# Patient Record
Sex: Male | Born: 1995 | Race: Black or African American | Hispanic: No | Marital: Single | Smoking: Never smoker
Health system: Southern US, Community
[De-identification: ages and names within clinical notes are randomized; demographics above are authoritative.]

---

## 2015-04-23 ENCOUNTER — Encounter (HOSPITAL_COMMUNITY): Payer: Self-pay | Admitting: Emergency Medicine

## 2015-04-23 ENCOUNTER — Emergency Department (HOSPITAL_COMMUNITY)
Admission: EM | Admit: 2015-04-23 | Discharge: 2015-04-23 | Disposition: A | Discharge status: Home / Self Care | Payer: BLUE CROSS/BLUE SHIELD | Attending: Emergency Medicine | Admitting: Emergency Medicine

## 2015-04-23 ENCOUNTER — Emergency Department (HOSPITAL_COMMUNITY): Payer: BLUE CROSS/BLUE SHIELD

## 2015-04-23 DIAGNOSIS — S3991XA Unspecified injury of abdomen, initial encounter: Secondary | ICD-10-CM | POA: Diagnosis not present

## 2015-04-23 DIAGNOSIS — Y998 Other external cause status: Secondary | ICD-10-CM | POA: Diagnosis not present

## 2015-04-23 DIAGNOSIS — S40012A Contusion of left shoulder, initial encounter: Secondary | ICD-10-CM | POA: Insufficient documentation

## 2015-04-23 DIAGNOSIS — S0001XA Abrasion of scalp, initial encounter: Secondary | ICD-10-CM | POA: Diagnosis not present

## 2015-04-23 DIAGNOSIS — Y9389 Activity, other specified: Secondary | ICD-10-CM | POA: Insufficient documentation

## 2015-04-23 DIAGNOSIS — Z23 Encounter for immunization: Secondary | ICD-10-CM | POA: Diagnosis not present

## 2015-04-23 DIAGNOSIS — S0993XA Unspecified injury of face, initial encounter: Secondary | ICD-10-CM | POA: Diagnosis not present

## 2015-04-23 DIAGNOSIS — S71152A Open bite, left thigh, initial encounter: Secondary | ICD-10-CM | POA: Diagnosis not present

## 2015-04-23 DIAGNOSIS — S71112A Laceration without foreign body, left thigh, initial encounter: Secondary | ICD-10-CM | POA: Insufficient documentation

## 2015-04-23 DIAGNOSIS — S40011A Contusion of right shoulder, initial encounter: Secondary | ICD-10-CM | POA: Insufficient documentation

## 2015-04-23 DIAGNOSIS — W540XXA Bitten by dog, initial encounter: Secondary | ICD-10-CM | POA: Diagnosis not present

## 2015-04-23 DIAGNOSIS — S20229A Contusion of unspecified back wall of thorax, initial encounter: Secondary | ICD-10-CM | POA: Diagnosis not present

## 2015-04-23 DIAGNOSIS — Y9289 Other specified places as the place of occurrence of the external cause: Secondary | ICD-10-CM | POA: Insufficient documentation

## 2015-04-23 DIAGNOSIS — S0003XA Contusion of scalp, initial encounter: Secondary | ICD-10-CM | POA: Diagnosis not present

## 2015-04-23 DIAGNOSIS — S81851A Open bite, right lower leg, initial encounter: Secondary | ICD-10-CM | POA: Diagnosis present

## 2015-04-23 DIAGNOSIS — S80811A Abrasion, right lower leg, initial encounter: Secondary | ICD-10-CM | POA: Diagnosis not present

## 2015-04-23 DIAGNOSIS — S30812A Abrasion of penis, initial encounter: Secondary | ICD-10-CM | POA: Insufficient documentation

## 2015-04-23 DIAGNOSIS — S0990XA Unspecified injury of head, initial encounter: Secondary | ICD-10-CM | POA: Diagnosis not present

## 2015-04-23 DIAGNOSIS — S3125XA Open bite of penis, initial encounter: Secondary | ICD-10-CM | POA: Diagnosis not present

## 2015-04-23 MED ORDER — TETANUS-DIPHTH-ACELL PERTUSSIS 5-2.5-18.5 LF-MCG/0.5 IM SUSP
0.5000 mL | Freq: Once | INTRAMUSCULAR | Status: AC
  Administered 2015-04-23: 0.5 mL via INTRAMUSCULAR
  Filled 2015-04-23: qty 0.5

## 2015-04-23 MED ORDER — HYDROCODONE-ACETAMINOPHEN 5-325 MG PO TABS: 1.0000 | ORAL_TABLET | Freq: Once | ORAL | Status: DC

## 2015-04-23 MED ORDER — AMOXICILLIN-POT CLAVULANATE 875-125 MG PO TABS: 1.0000 | ORAL_TABLET | Freq: Two times a day (BID) | ORAL | Status: AC

## 2015-04-23 MED ORDER — IBUPROFEN 600 MG PO TABS: 600.0000 mg | ORAL_TABLET | Freq: Four times a day (QID) | ORAL | Status: AC | PRN

## 2015-04-23 MED ORDER — LIDOCAINE HCL 4 % EX SOLN
Freq: Once | CUTANEOUS | Status: AC
  Administered 2015-04-23: 1 mL via TOPICAL
  Filled 2015-04-23: qty 50

## 2015-04-23 MED ORDER — HYDROCODONE-ACETAMINOPHEN 5-325 MG PO TABS: 1.0000 | ORAL_TABLET | ORAL | Status: AC | PRN

## 2015-04-23 NOTE — ED Notes (Signed)
PA at bedside.
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
## 2015-04-23 NOTE — ED Notes (Signed)
Wounds washed out on penis and legs with normal saline per ED PA.

## 2015-04-23 NOTE — Discharge Instructions (Signed)
Take augmentin as prescribed until all gone to prevent infection. Wash all wounds with soap and water. Apply topical antibiotic ointment twice a day. Ibuprofen for pain. norco for severe pain. If any problems with urination or erections, follow up urology as referred. Watch for any signs of infection such as redness, swelling, drainage.    Animal Bite Animal bites can range from mild to serious. An animal bite can result in a scratch on the skin, a deep open cut, a puncture of the skin, a crush injury, or tearing away of the skin or a body part. A small bite from a house pet will usually not cause serious problems. However, some animal bites can become infected or injure a bone or other tissue.  Bites from certain animals can be more dangerous because of the risk of spreading rabies, which is a serious viral infection. This risk is higher with bites from stray animals or wild animals, such as raccoons, foxes, skunks, and bats. Dogs are responsible for most animal bites. Children are bitten more often than adults. SYMPTOMS  Common symptoms of an animal bite include:   Pain.   Bleeding.   Swelling.   Bruising.  DIAGNOSIS  This condition may be diagnosed based on a physical exam and medical history. Your health care provider will examine the wound and ask for details about the animal and how the bite happened. You may also have tests, such as:   Blood tests to check for infection or to determine if surgery is needed.  X-rays to check for damage to bones or joints.  Culture test. This uses a sample of fluid from the wound to check for infection. TREATMENT  Treatment varies depending on the location and type of animal bite and your medical history. Treatment may include:   Wound care. This often includes cleaning the wound, flushing the wound with saline solution, and applying a bandage (dressing). Sometimes, the wound is left open to heal because of the high risk of infection. However, in  some cases, the wound may be closed with stitches (sutures), staples, skin glue, or adhesive strips.   Antibiotic medicine.   Tetanus shot.   Rabies treatment if the animal could have rabies.  In some cases, bites that have become infected may require IV antibiotics and surgical treatment in the hospital.  HOME CARE INSTRUCTIONS Wound Care  Follow instructions from your health care provider about how to take care of your wound. Make sure you:  Wash your hands with soap and water before you change your dressing. If soap and water are not available, use hand sanitizer.  Change your dressing as told by your health care provider.  Leave sutures, skin glue, or adhesive strips in place. These skin closures may need to be in place for 2 weeks or longer. If adhesive strip edges start to loosen and curl up, you may trim the loose edges. Do not remove adhesive strips completely unless your health care provider tells you to do that.  Check your wound every day for signs of infection. Watch for:   Increasing redness, swelling, or pain.   Fluid, blood, or pus.  General Instructions  Take or apply over-the-counter and prescription medicines only as told by your health care provider.   If you were prescribed an antibiotic, take or apply it as told by your health care provider. Do not stop using the antibiotic even if your condition improves.   Keep the injured area raised (elevated) above the level of  your heart while you are sitting or lying down, if this is possible.   If directed, apply ice to the injured area.   Put ice in a plastic bag.   Place a towel between your skin and the bag.   Leave the ice on for 20 minutes, 2-3 times per day.   Keep all follow-up visits as told by your health care provider. This is important.  SEEK MEDICAL CARE IF:  You have increasing redness, swelling, or pain at the site of your wound.   You have a general feeling of sickness  (malaise).   You feel nauseous or you vomit.   You have pain that does not get better.  SEEK IMMEDIATE MEDICAL CARE IF:  You have a red streak extending away from your wound.   You have fluid, blood, or pus coming from your wound.   You have a fever or chills.   You have trouble moving your injured area.   You have numbness or tingling extending beyond the wound.   This information is not intended to replace advice given to you by your health care provider. Make sure you discuss any questions you have with your health care provider.   Document Released: 12/21/2010 Document Revised: 12/24/2014 Document Reviewed: 08/20/2014 Elsevier Interactive Patient Education 2016 [employer].  Concussion, Adult A concussion, or closed-head injury, is a brain injury caused by a direct blow to the head or by a quick and sudden movement (jolt) of the head or neck. Concussions are usually not life-threatening. Even so, the effects of a concussion can be serious. If you have had a concussion before, you are more likely to experience concussion-like symptoms after a direct blow to the head.  CAUSES  Direct blow to the head, such as from running into another player during a soccer game, being hit in a fight, or hitting your head on a hard surface.  A jolt of the head or neck that causes the brain to move back and forth inside the skull, such as in a car crash. SIGNS AND SYMPTOMS The signs of a concussion can be hard to notice. Early on, they may be missed by you, family members, and health care providers. You may look fine but act or feel differently. Symptoms are usually temporary, but they may last for days, weeks, or even longer. Some symptoms may appear right away while others may not show up for hours or days. Every head injury is different. Symptoms include:  Mild to moderate headaches that will not go away.  A feeling of pressure inside your head.  Having more trouble than  usual:  Learning or remembering things you have heard.  Answering questions.  Paying attention or concentrating.  Organizing daily tasks.  Making decisions and solving problems.  Slowness in thinking, acting or reacting, speaking, or reading.  Getting lost or being easily confused.  Feeling tired all the time or lacking energy (fatigued).  Feeling drowsy.  Sleep disturbances.  Sleeping more than usual.  Sleeping less than usual.  Trouble falling asleep.  Trouble sleeping (insomnia).  Loss of balance or feeling lightheaded or dizzy.  Nausea or vomiting.  Numbness or tingling.  Increased sensitivity to:  Sounds.  Lights.  Distractions.  Vision problems or eyes that tire easily.  Diminished sense of taste or smell.  Ringing in the ears.  Mood changes such as feeling sad or anxious.  Becoming easily irritated or angry for little or no reason.  Lack of motivation.  Seeing or hearing things other people do not see or hear (hallucinations). DIAGNOSIS Your health care provider can usually diagnose a concussion based on a description of your injury and symptoms. He or she will ask whether you passed out (lost consciousness) and whether you are having trouble remembering events that happened right before and during your injury. Your evaluation might include:  A brain scan to look for signs of injury to the brain. Even if the test shows no injury, you may still have a concussion.  Blood tests to be sure other problems are not present. TREATMENT  Concussions are usually treated in an emergency department, in urgent care, or at a clinic. You may need to stay in the hospital overnight for further treatment.  Tell your health care provider if you are taking any medicines, including prescription medicines, over-the-counter medicines, and natural remedies. Some medicines, such as blood thinners (anticoagulants) and aspirin, may increase the chance of complications.  Also tell your health care provider whether you have had alcohol or are taking illegal drugs. This information may affect treatment.  Your health care provider will send you home with important instructions to follow.  How fast you will recover from a concussion depends on many factors. These factors include how severe your concussion is, what part of your brain was injured, your age, and how healthy you were before the concussion.  Most people with mild injuries recover fully. Recovery can take time. In general, recovery is slower in older persons. Also, persons who have had a concussion in the past or have other medical problems may find that it takes longer to recover from their current injury. HOME CARE INSTRUCTIONS General Instructions  Carefully follow the directions your health care provider gave you.  Only take over-the-counter or prescription medicines for pain, discomfort, or fever as directed by your health care provider.  Take only those medicines that your health care provider has approved.  Do not drink alcohol until your health care provider says you are well enough to do so. Alcohol and certain other drugs may slow your recovery and can put you at risk of further injury.  If it is harder than usual to remember things, write them down.  If you are easily distracted, try to do one thing at a time. For example, do not try to watch TV while fixing dinner.  Talk with family members or close friends when making important decisions.  Keep all follow-up appointments. Repeated evaluation of your symptoms is recommended for your recovery.  Watch your symptoms and tell others to do the same. Complications sometimes occur after a concussion. Older adults with a brain injury may have a higher risk of serious complications, such as a blood clot on the brain.  Tell your teachers, school nurse, school counselor, coach, athletic trainer, or work Production designer, theatre/television/filmmanager about your injury, symptoms, and  restrictions. Tell them about what you can or cannot do. They should watch for:  Increased problems with attention or concentration.  Increased difficulty remembering or learning new information.  Increased time needed to complete tasks or assignments.  Increased irritability or decreased ability to cope with stress.  Increased symptoms.  Rest. Rest helps the brain to heal. Make sure you:  Get plenty of sleep at night. Avoid staying up late at night.  Keep the same bedtime hours on weekends and weekdays.  Rest during the day. Take daytime naps or rest breaks when you feel tired.  Limit activities that require a lot of thought  or concentration. These include:  Doing homework or job-related work.  Watching TV.  Working on the computer.  Avoid any situation where there is potential for another head injury (football, hockey, soccer, basketball, martial arts, downhill snow sports and horseback riding). Your condition will get worse every time you experience a concussion. You should avoid these activities until you are evaluated by the appropriate follow-up health care providers. Returning To Your Regular Activities You will need to return to your normal activities slowly, not all at once. You must give your body and brain enough time for recovery.  Do not return to sports or other athletic activities until your health care provider tells you it is safe to do so.  Ask your health care provider when you can drive, ride a bicycle, or operate heavy machinery. Your ability to react may be slower after a brain injury. Never do these activities if you are dizzy.  Ask your health care provider about when you can return to work or school. Preventing Another Concussion It is very important to avoid another brain injury, especially before you have recovered. In rare cases, another injury can lead to permanent brain damage, brain swelling, or death. The risk of this is greatest during the first  7-10 days after a head injury. Avoid injuries by:  Wearing a seat belt when riding in a car.  Drinking alcohol only in moderation.  Wearing a helmet when biking, skiing, skateboarding, skating, or doing similar activities.  Avoiding activities that could lead to a second concussion, such as contact or recreational sports, until your health care provider says it is okay.  Taking safety measures in your home.  Remove clutter and tripping hazards from floors and stairways.  Use grab bars in bathrooms and handrails by stairs.  Place non-slip mats on floors and in bathtubs.  Improve lighting in dim areas. SEEK MEDICAL CARE IF:  You have increased problems paying attention or concentrating.  You have increased difficulty remembering or learning new information.  You need more time to complete tasks or assignments than before.  You have increased irritability or decreased ability to cope with stress.  You have more symptoms than before. Seek medical care if you have any of the following symptoms for more than 2 weeks after your injury:  Lasting (chronic) headaches.  Dizziness or balance problems.  Nausea.  Vision problems.  Increased sensitivity to noise or light.  Depression or mood swings.  Anxiety or irritability.  Memory problems.  Difficulty concentrating or paying attention.  Sleep problems.  Feeling tired all the time. SEEK IMMEDIATE MEDICAL CARE IF:  You have severe or worsening headaches. These may be a sign of a blood clot in the brain.  You have weakness (even if only in one hand, leg, or part of the face).  You have numbness.  You have decreased coordination.  You vomit repeatedly.  You have increased sleepiness.  One pupil is larger than the other.  You have convulsions.  You have slurred speech.  You have increased confusion. This may be a sign of a blood clot in the brain.  You have increased restlessness, agitation, or  irritability.  You are unable to recognize people or places.  You have neck pain.  It is difficult to wake you up.  You have unusual behavior changes.  You lose consciousness. MAKE SURE YOU:  Understand these instructions.  Will watch your condition.  Will get help right away if you are not doing well or get  worse.   This information is not intended to replace advice given to you by your health care provider. Make sure you discuss any questions you have with your health care provider.   Document Released: 06/25/2003 Document Revised: 04/25/2014 Document Reviewed: 10/25/2012 Elsevier Interactive Patient Education [employer]
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
